# Patient Record
Sex: Male | Born: 1995 | Race: White | Hispanic: No | Marital: Single | Smoking: Current every day smoker
Health system: Southern US, Community
[De-identification: ages and names within clinical notes are randomized; demographics above are authoritative.]

## PROBLEM LIST (undated history)

## (undated) DIAGNOSIS — R569 Unspecified convulsions: Secondary | ICD-10-CM

---
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
## 2015-01-28 ENCOUNTER — Other Ambulatory Visit: Payer: BLUE CROSS/BLUE SHIELD

## 2015-01-28 ENCOUNTER — Ambulatory Visit (INDEPENDENT_AMBULATORY_CARE_PROVIDER_SITE_OTHER): Payer: BLUE CROSS/BLUE SHIELD | Admitting: Neurology

## 2015-01-28 ENCOUNTER — Encounter: Payer: Self-pay | Admitting: Neurology

## 2015-01-28 VITALS — BP 128/80 | HR 63 | Ht 71.0 in | Wt 198.0 lb

## 2015-01-28 DIAGNOSIS — R9089 Other abnormal findings on diagnostic imaging of central nervous system: Secondary | ICD-10-CM

## 2015-01-28 DIAGNOSIS — R930 Abnormal findings on diagnostic imaging of skull and head, not elsewhere classified: Secondary | ICD-10-CM | POA: Diagnosis not present

## 2015-01-28 DIAGNOSIS — G40009 Localization-related (focal) (partial) idiopathic epilepsy and epileptic syndromes with seizures of localized onset, not intractable, without status epilepticus: Secondary | ICD-10-CM | POA: Diagnosis not present

## 2015-01-28 MED ORDER — LAMOTRIGINE ER 100 MG PO TB24: ORAL_TABLET | ORAL | Status: DC

## 2015-01-28 NOTE — Patient Instructions (Signed)
1. Continue Lamictal ER 100mg  daily, start taking at night and see if this helps with appetite 2. Bloodwork for Lamictal level 3. Follow-up in 2 months, call for any changes  Seizure Precautions: 1. If medication has been prescribed for you to prevent seizures, take it exactly as directed.  Do not stop taking the medicine without talking to your doctor first, even if you have not had a seizure in a long time.   2. Avoid activities in which a seizure would cause danger to yourself or to others.  Don't operate dangerous machinery, swim alone, or climb in high or dangerous places, such as on ladders, roofs, or girders.  Do not drive unless your doctor says you may.  3. If you have any warning that you may have a seizure, lay down in a safe place where you can't hurt yourself.    4.  No driving for 6 months from last seizure, [place].   Please refer to the following link on the Epile[employer] of [employer]'s website for more information: http://www.epilepsyfoundation.org/answerplace/Social/driving/drivingu.cfm   5.  Maintain good sleep hygiene.  6.  Contact your doctor if you have any problems that may be related to the medicine you are taking.  7.  Call 911 and bring the patient back to the ED if:        A.  The seizure lasts longer than 5 minutes.       B.  The patient doesn't awaken shortly after the seizure  C.  The patient has new problems such as difficulty seeing, speaking or moving  D.  The patient was injured during the seizure  E.  The patient has a temperature over 102 F (39C)  F.  The patient vomited and now is having trouble breathing

## 2015-01-28 NOTE — Progress Notes (Signed)
NEUROLOGY FOLLOW UP OFFICE NOTE  Larry Martinez [id number]  HISTORY OF PRESENT ILLNESS: I had the pleasure of seeing Larry Martinez in follow-up in the neurology clinic on 01/28/2015.  The patient was last seen 3 months ago for new onset convulsion last 09/06/14. He is accompanied by his parents who help supplement the history today. Since his last visit, he continues to do well with no seizures since June 2016. He is tolerating Lamotrigine ER  daily without significant side effects, no dizziness, diplopia, gait instability or falls, but reports changes in appetite. He is not hungry at breakfast or lunch, he eats at 2pm, then at midnight. He usually takes his medication at noon. He denies any episodes of vision changes, focal numbness/tingling/weakness, no gaps in times or staring/unresponsive episodes.   HPI: This is a pleasant 19 yo LH man with a history of recurrent childhood episodes of right-sided weakness that resolved at age 53, unclear diagnosis, presenting after a generalized tonic-clonic seizure last 09/06/2014. His mother reports that from age 12 months up to 4 years, he would have episodes where the right side of his body would become paralyzed, the right side of his face would droop with drooling, lasting 6 minutes, occurring 2-3 times a year. No convulsive activity or jerking. He was evaluated in [place] and [place] with several MRIs, and was not started on any medication. His mother recalls being told there was a "slight abnormality in the back or the brainstem." Since then, he denied any further right-sided episodes but would have "trigger sparks" of light on the right visual field lasting up to 6 minutes, occurring once a year or so. This year he had it twice. He was sitting in the back of the car on 09/06/14 and saw the trigger sparks on his right visual field, then had tingling in his right foot that shot up the entire right side. He was unable to see anything out the  right side, and tried to speak but could not verbalize. He kicked his mother's seat with his left left to get her attention, and when she looked back she heard him making growling noises with his head turned to the left side, body tensed up for 8 minutes. He had urinary incontinence. He recalls waking up in the ambulance with right-sided numbness, he felt he could not move his entire body, his speech was slurred for a while. He was brought to [facility] ER where CBC showed a WBC of 15.4, neutrophils 80%, CMP showed a creatinine of 1.29, LFTs normal, UDS positive for opiates and THC. I personally reviewed head CT without contrast which did not show any acute changes. He does admit to using THC, but denies any opiate intake. He denies any sleep deprivation or alcohol use prior to the seizure. He uses the hookah daily.   His mother brings some records from his childhood. He started having symptoms at age 45 months. His eyes are always fixed to the right, drooling, he cannot move his limbs. These episodes occur when he is about to wake up or go to sleep. He was reported to have right hand jerking. He was evaluated at [facility] by Dr. Carolanne Grumbling, impression was recurrent partial seizures, suggestive of benign rolandic epilepsy. They moved to [place], and there were several more seizures in 2002, with note of right arm and leg weakness that lasted up to 1-1/2 hours. He was evaluated by epileptologist Dr. Inez Catalina at [facility], per mother's notes, there were no physical abnormalities on MRI but  the EEG showed a slight abnormality in his brain waves, similar to results from [place], reports unavailable for review. There were no further seizures after 01/13/2001.   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 Otherwise had a normal early development. There is no history of febrile convulsions, CNS infections such as meningitis/encephalitis, significant traumatic brain injury, neurosurgical procedures, or family history of seizures.  Diagnostic Data: I personally reviewed MRI brain with and without contrast done 09/19/14 and discussed this with the radiologist as well. There is asymmetry noted in the left caudate nucleus, the head is present however the body appears to be absent, with the tail presumably absent as well. There is a focal convex outpouching along the body of the left lateral ventricle where the body of the caudate should be. There is a 1.87mm cyst at the anterior margin of the left caudate head. No discrete heterotopia identified. The temporal horn of the left lateral ventricle is mildly enlarged compared to the right with mild rotation of the left hippocampal head and body, however no abnormal signal is identified in the hippocampus. There is a hyperintense T1 and T2 signal along the left aspect of the posterior falx at the level of the occipital lobes measuring 1-14mm in thickness without mass effect, suggestive of trace subdural hematoma. His routine EEG was abnormal with focal slowing over the left hemisphere, maximal over the left temporo-occipital region, no epileptiform discharges seen.  PAST MEDICAL HISTORY: Past Medical History  Diagnosis Date  . Seizures (HCC)     MEDICATIONS: Current Outpatient Prescriptions on File Prior to Visit  Medication Sig Dispense Refill  . LamoTRIgine 100 MG TB24 Take 1 tablet daily 30 tablet 6   No current facility-administered medications on file prior to visit.    ALLERGIES: No Known Allergies  FAMILY HISTORY: Family History  Problem Relation Age of Onset  . Heart disease Maternal Grandfather   . Heart disease Maternal Grandfather   . Diabetes Maternal Grandmother   .  Diabetes Maternal Grandfather   . Hypertension Mother   . Cancer Paternal Grandfather   . Cancer Maternal Grandmother     SOCIAL HISTORY: Social History   Social History  . Marital Status: Single    Spouse Name: N/A  . Number of Children: N/A  . Years of Education: N/A   Occupational History  . Not on file.   Social History Main Topics  . Smoking status: Current Every Day Smoker    Types: E-cigarettes  . Smokeless tobacco: Never Used  . Alcohol Use: No  . Drug Use: Yes    Special: Marijuana  . Sexual Activity: Not on file   Other Topics Concern  . Not on file   Social History Narrative    REVIEW OF SYSTEMS: Constitutional: No fevers, chills, or sweats, no generalized fatigue,+ change in appetite Eyes: No visual changes, double vision, eye pain Ear, nose and throat: No hearing loss, ear pain, nasal congestion, sore throat Cardiovascular: No chest pain, palpitations Respiratory:  No shortness of breath at rest or with exertion, wheezes GastrointestinaI: No nausea, vomiting, diarrhea, abdominal pain, fecal incontinence Genitourinary:  No dysuria, urinary retention or frequency Musculoskeletal:  No neck pain, back  pain Integumentary: No rash, pruritus, skin lesions Neurological: as above Psychiatric: No depression, insomnia, anxiety Endocrine: No palpitations, fatigue, diaphoresis, mood swings,+ change in appetite, no change in weight, increased thirst Hematologic/Lymphatic:  No anemia, purpura, petechiae. Allergic/Immunologic: no itchy/runny eyes, nasal congestion, recent allergic reactions, rashes  PHYSICAL EXAM: Filed Vitals:   01/28/15 0945  BP: 128/80  Pulse: 63   General: No acute distress Head:  Normocephalic/atraumatic Neck: supple, no paraspinal tenderness, full range of motion Heart:  Regular rate and rhythm Lungs:  Clear to auscultation bilaterally Back: No paraspinal tenderness Skin/Extremities: No rash, no edema Neurological Exam: alert and  oriented to person, place, and time. No aphasia or dysarthria. Fund of knowledge is appropriate.  Recent and remote memory are intact. 2/3 delayed recall.  Attention and concentration are normal.    Able to name objects and repeat phrases. Cranial nerves: Pupils equal, round, reactive to light.  Fundoscopic exam unremarkable, no papilledema. Extraocular movements intact with no nystagmus. Visual fields full. Facial sensation intact. No facial asymmetry. Tongue, uvula, palate midline.  Motor: Bulk and tone normal, muscle strength 5/5 throughout with no pronator drift.  Sensation to light touch intact.  No extinction to double simultaneous stimulation.  Deep tendon reflexes 2+ throughout, toes downgoing.  Finger to nose testing intact.  Gait narrow-based and steady, able to tandem walk adequately.  Romberg negative.  IMPRESSION: This is an 19 yo LH man with a history of childhood episodes of recurrent right-sided weakness with right gaze deviation that resolved at age 89, recurrent episodes of vision changes on the right visual field, who had the same visual changes followed by right-sided sensory symptoms, speech arrest, then a witnessed GTC last 09/06/14. 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 It is unclear how he would have a subdural, he did not hit his head in the car, I wonder about change in the occipital region with the visual symptoms he has been having for several years. He denies any further visual changes or convulsions since starting Lamictal. He reports some appetite changes and will try taking medication at night and monitor appetite. A baseline Lamictal level will be done today. He is aware of [place] driving laws that indicate one should not drive until 6 months seizure-free. He will follow-up in 2 months and knows to call our office for any  problems in the interim.  Thank you for allowing me to participate in his care.  Please do not hesitate to call for any questions or concerns.  The duration of this appointment visit was 24 minutes of face-to-face time with the patient.  Greater than 50% of this time was spent in counseling, explanation of diagnosis, planning of further management, and coordination of care.   Patrcia DollyKaren Aquino, M.D.   CC: Dr. Tommi EmeryHepler

## 2015-01-29 ENCOUNTER — Other Ambulatory Visit: Payer: Self-pay | Admitting: Family Medicine

## 2015-01-29 DIAGNOSIS — G40009 Localization-related (focal) (partial) idiopathic epilepsy and epileptic syndromes with seizures of localized onset, not intractable, without status epilepticus: Secondary | ICD-10-CM

## 2015-01-29 MED ORDER — LAMOTRIGINE ER 100 MG PO TB24: ORAL_TABLET | ORAL | Status: DC

## 2015-01-29 NOTE — Telephone Encounter (Signed)
90 day refill request

## 2015-02-03 LAB — LAMOTRIGINE LEVEL: LAMOTRIGINE LVL: 2.2 ug/mL — AB (ref 4.0–18.0)

## 2015-02-06 ENCOUNTER — Telehealth: Payer: Self-pay | Admitting: Neurology

## 2015-02-06 DIAGNOSIS — G40009 Localization-related (focal) (partial) idiopathic epilepsy and epileptic syndromes with seizures of localized onset, not intractable, without status epilepticus: Secondary | ICD-10-CM

## 2015-02-06 MED ORDER — LAMOTRIGINE ER 100 MG PO TB24
ORAL_TABLET | ORAL | Status: DC
Start: 2015-02-06 — End: 2015-03-18

## 2015-02-06 NOTE — Telephone Encounter (Signed)
Spoke to mother, Larry Martinez Lamictal level was low 2.2. Typically range is between 4-12, if he is not having seizures, potentially we can just stay on this dose and follow him clinically, however he may benefit from having more cushion if he gets sleep-deprived or misses doses. Mother agreeable to increase dose to Lamotrigine ER 200mg /day. Refills sent for new dosing instructions.

## 2015-02-07 ENCOUNTER — Telehealth: Payer: Self-pay | Admitting: Neurology

## 2015-02-07 NOTE — Telephone Encounter (Signed)
Spoke with patient's mom. She states patient had not taken his meds the day he had his level checked and she wanted to make sure this was taken into consideration before he increased his medication. Please advise.

## 2015-02-07 NOTE — Telephone Encounter (Signed)
Pls let her know that is exactly when we want to check his levels, we want to see how much is in his system when it is at the lowest. Thanks

## 2015-02-07 NOTE — Telephone Encounter (Signed)
Patient's dad make aware of advise from Dr Karel JarvisAquino.

## 2015-02-07 NOTE — Telephone Encounter (Signed)
Pt/mother called about med/ Lamotrigine/concerns//call back @ [phone]

## 2015-03-18 ENCOUNTER — Ambulatory Visit (INDEPENDENT_AMBULATORY_CARE_PROVIDER_SITE_OTHER): Payer: BLUE CROSS/BLUE SHIELD | Admitting: Neurology

## 2015-03-18 ENCOUNTER — Encounter: Payer: Self-pay | Admitting: Neurology

## 2015-03-18 VITALS — BP 112/72 | HR 89 | Ht 71.0 in | Wt 198.0 lb

## 2015-03-18 DIAGNOSIS — G40009 Localization-related (focal) (partial) idiopathic epilepsy and epileptic syndromes with seizures of localized onset, not intractable, without status epilepticus: Secondary | ICD-10-CM

## 2015-03-18 DIAGNOSIS — R9089 Other abnormal findings on diagnostic imaging of central nervous system: Secondary | ICD-10-CM

## 2015-03-18 DIAGNOSIS — R930 Abnormal findings on diagnostic imaging of skull and head, not elsewhere classified: Secondary | ICD-10-CM | POA: Diagnosis not present

## 2015-03-18 MED ORDER — LAMOTRIGINE ER 100 MG PO TB24
ORAL_TABLET | ORAL | Status: DC
Start: 2015-03-18 — End: 2016-01-19

## 2015-03-18 NOTE — Patient Instructions (Signed)
1. Continue Lamotrigine ER 100mg : Take 2 tablets daily 2. Follow-up in 6 months, call for any changes  Seizure Precautions: 1. If medication has been prescribed for you to prevent seizures, take it exactly as directed.  Do not stop taking the medicine without talking to your doctor first, even if you have not had a seizure in a long time.   2. Avoid activities in which a seizure would cause danger to yourself or to others.  Don't operate dangerous machinery, swim alone, or climb in high or dangerous places, such as on ladders, roofs, or girders.  Do not drive unless your doctor says you may.  3. If you have any warning that you may have a seizure, lay down in a safe place where you can't hurt yourself.    4.  No driving for 6 months from last seizure, [place].   Please refer to the following link on the [employer]'s website for more information: http://www.epilepsyfoundation.org/answerplace/Social/driving/drivingu.cfm   5.  Maintain good sleep hygiene. Avoid alcohol.  6.  Contact your doctor if you have any problems that may be related to the medicine you are taking.  7.  Call 911 and bring the patient back to the ED if:        A.  The seizure lasts longer than 5 minutes.       B.  The patient doesn't awaken shortly after the seizure  C.  The patient has new problems such as difficulty seeing, speaking or moving  D.  The patient was injured during the seizure  E.  The patient has a temperature over 102 F (39C)  F.  The patient vomited and now is having trouble breathing

## 2015-03-18 NOTE — Progress Notes (Signed)
NEUROLOGY FOLLOW UP OFFICE NOTE  Larry Martinez [id number]  HISTORY OF PRESENT ILLNESS: I had the pleasure of seeing Larry Martinez in follow-up in the neurology clinic on 02/16/2015.  The patient was last seen 2 months ago for new onset convulsion last 09/06/14. He is accompanied by his parents who help supplement the history today. Since his last visit, he continues to do well with no seizures since June 2016. He reported changes in appetite with Lamotrigine ER, and was instructed to try taking it at night. His family noted mood changes and is now back to taking it at noon, which helped improve mood changes. He reports appetite is better. His Lamictal level was low at 2.2, we had discussed increasing dose to /day, which he is tolerating without side effects. He denies any episodes of vision changes, focal numbness/tingling/weakness, no gaps in times or staring/unresponsive episodes. He has noticed he is more sensitive to bright Christmas lights, but no seizure-like symptoms triggered.  HPI 09/09/2014: This is a pleasant 19 yo LH man with a history of recurrent childhood episodes of right-sided weakness that resolved at age 19, unclear diagnosis, presenting after a generalized tonic-clonic seizure last 09/06/2014. His mother reports that from age 79 months up to 4 years, he would have episodes where the right side of his body would become paralyzed, the right side of his face would droop with drooling, lasting 6 minutes, occurring 2-3 times a year. No convulsive activity or jerking. He was evaluated in [place] and [place] with several MRIs, and was not started on any medication. His mother recalls being told there was a "slight abnormality in the back or the brainstem." Since then, he denied any further right-sided episodes but would have "trigger sparks" of light on the right visual field lasting up to 6 minutes, occurring once a year or so. This year he had it twice. He was sitting in  the back of the car on 09/06/14 and saw the trigger sparks on his right visual field, then had tingling in his right foot that shot up the entire right side. He was unable to see anything out the right side, and tried to speak but could not verbalize. He kicked his mother's seat with his left left to get her attention, and when she looked back she heard him making growling noises with his head turned to the left side, body tensed up for 8 minutes. He had urinary incontinence. He recalls waking up in the ambulance with right-sided numbness, he felt he could not move his entire body, his speech was slurred for a while. He was brought to [facility] ER where CBC showed a WBC of 15.4, neutrophils 80%, CMP showed a creatinine of 1.29, LFTs normal, UDS positive for opiates and THC. I personally reviewed head CT without contrast which did not show any acute changes. He does admit to using THC, but denies any opiate intake. He denies any sleep deprivation or alcohol use prior to the seizure. He uses the hookah daily.   His mother brings some records from his childhood. He started having symptoms at age 19 months. His eyes are always fixed to the right, drooling, he cannot move his limbs. These episodes occur when he is about to wake up or go to sleep. He was reported to have right hand jerking. He was evaluated at [facility] by Dr. Carolanne Martinez, impression was recurrent partial seizures, suggestive of benign rolandic epilepsy. [place] moved to [place], and there were several more seizures in 2002, with  note of right arm and leg weakness that lasted up to 1-1/2 hours. He was evaluated by epileptologist Dr. Inez Martinez at [facility], per mother's notes, there were no physical abnormalities on MRI but the EEG showed a slight abnormality in his brain waves, similar to results from [place], reports unavailable for review. There were no further seizures after 01/13/2001.   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 Otherwise had a normal early development. There is no history of febrile convulsions, CNS infections such as meningitis/encephalitis, significant traumatic brain injury, neurosurgical procedures, or family history of seizures.  Diagnostic Data: I personally reviewed MRI brain with and without contrast done 09/19/14 and discussed this with the radiologist as well. There is asymmetry noted in the left caudate nucleus, the head is present however the body appears to be absent, with the tail presumably absent as well. There is a focal convex outpouching along the body of the left lateral ventricle where the body of the caudate should be. There is a 1.56mm cyst at the anterior margin of the left caudate head. No discrete heterotopia identified. The temporal horn of the left lateral ventricle is mildly enlarged compared to the right with mild rotation of the left hippocampal head and body, however no abnormal signal is identified in the hippocampus. There is a hyperintense T1 and T2 signal along the left aspect of the posterior falx at the level of the occipital lobes measuring 1-57mm in thickness without mass effect, suggestive of trace subdural hematoma. His routine EEG was abnormal with focal slowing over the left hemisphere, maximal over the left temporo-occipital region, no epileptiform discharges seen.  PAST MEDICAL HISTORY: Past Medical History  Diagnosis Date  . Seizures (HCC)     MEDICATIONS: Current Outpatient Prescriptions on File Prior to Visit  Medication Sig Dispense Refill  . LamoTRIgine 100 MG TB24 Take 2 tablet daily 180 tablet 3   No current facility-administered medications on file prior to visit.    ALLERGIES: No Known  Allergies  FAMILY HISTORY: Family History  Problem Relation Age of Onset  . Heart disease Maternal Grandfather   . Heart disease Maternal Grandfather   . Diabetes Maternal Grandmother   . Diabetes Maternal Grandfather   . Hypertension Mother   . Cancer Paternal Grandfather   . Cancer Maternal Grandmother     SOCIAL HISTORY: Social History   Social History  . Marital Status: Single    Spouse Name: N/A  . Number of Children: N/A  . Years of Education: N/A   Occupational History  . Not on file.   Social History Main Topics  . Smoking status: Current Every Day Smoker    Types: E-cigarettes  . Smokeless tobacco: Never Used  . Alcohol Use: No  . Drug Use: Yes    Special: Marijuana  . Sexual Activity: Not on file   Other Topics Concern  . Not on file   Social History Narrative    REVIEW OF SYSTEMS: Constitutional: No fevers, chills, or sweats, no generalized fatigue, change in appetite Eyes: No visual changes, double vision, eye pain Ear, nose and throat: No hearing loss, ear pain, nasal congestion, sore throat Cardiovascular: No chest pain, palpitations Respiratory:  No shortness of breath at rest or with exertion, wheezes GastrointestinaI: No nausea, vomiting, diarrhea, abdominal pain, fecal incontinence Genitourinary:  No dysuria, urinary retention or frequency Musculoskeletal:  No neck pain, back pain Integumentary: No rash, pruritus, skin lesions Neurological: as above Psychiatric: No depression, insomnia, anxiety Endocrine: No palpitations, fatigue, diaphoresis, mood swings, change in appetite, no change in weight, increased thirst Hematologic/Lymphatic:  No anemia, purpura, petechiae. Allergic/Immunologic: no itchy/runny eyes, nasal congestion, recent allergic reactions, rashes  PHYSICAL EXAM: Filed Vitals:   03/18/15 1135  BP: 112/72  Pulse: 89   General: No acute distress Head:  Normocephalic/atraumatic Neck: supple, no paraspinal tenderness, full  range of motion Heart:  Regular rate and rhythm Lungs:  Clear to auscultation bilaterally Back: No paraspinal tenderness Skin/Extremities: No rash, no edema Neurological Exam: alert and oriented to person, place, and time. No aphasia or dysarthria. Fund of knowledge is appropriate.  Recent and remote memory are intact. 3/3 delayed recall.  Attention and concentration are normal.    Able to name objects and repeat phrases. Cranial nerves: Pupils equal, round, reactive to light.  Extraocular movements intact with no nystagmus. Visual fields full. Facial sensation intact. No facial asymmetry. Tongue, uvula, palate midline.  Motor: Bulk and tone normal, muscle strength 5/5 throughout with no pronator drift.  Sensation to light touch intact.  No extinction to double simultaneous stimulation.  Finger to nose testing intact.  Gait narrow-based and steady, able to tandem walk adequately.  Romberg negative.  IMPRESSION: This is an 19 yo LH man with a history of childhood episodes of recurrent right-sided weakness with right gaze deviation that resolved at age 89, recurrent episodes of vision changes on the right visual field, who had the same visual changes followed by right-sided sensory symptoms, speech arrest, then a witnessed GTC last 09/06/14. 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 It is unclear how he would have a subdural, he did not hit his head in the car, I wonder about change in the occipital region with the visual symptoms he has been having for several years. He denies any further visual changes or convulsions since starting Lamictal. He is now on Lamotrigine ER 200mg /day with no side effects, no seizures since 09/06/14. We again discussed avoidance of seizure triggers, including missing medications, alcohol, and sleep deprivation. We again discussed  [place] driving laws that indicate one should not drive until 6 months seizure-free. He will follow-up in 6 months and knows to call our office for any problems in the interim.  Thank you for allowing me to participate in his care.  Please do not hesitate to call for any questions or concerns.  The duration of this appointment visit was 15 minutes of face-to-face time with the patient.  Greater than 50% of this time was spent in counseling, explanation of diagnosis, planning of further management, and coordination of care.   Larry Martinez, M.D.   CC: Dr. Tommi EmeryHepler

## 2015-05-23 ENCOUNTER — Encounter: Payer: Self-pay | Admitting: Neurology

## 2015-09-12 ENCOUNTER — Telehealth: Payer: Self-pay | Admitting: Neurology

## 2015-09-12 DIAGNOSIS — G40009 Localization-related (focal) (partial) idiopathic epilepsy and epileptic syndromes with seizures of localized onset, not intractable, without status epilepticus: Secondary | ICD-10-CM

## 2015-09-12 MED ORDER — LAMOTRIGINE ER 200 MG PO TB24: 200.0000 mg | ORAL_TABLET | Freq: Every day | ORAL | Status: DC

## 2015-09-12 NOTE — Telephone Encounter (Signed)
Called father, Lorin PicketScott, to inform him that unfortuately, I have no idea about cost. Gave options of sending in the 100 BID or the 200 mg daily, or wait until Monday and give pt time to call insurance and/or Pharmacy to find out what way would be cheapest. Father requested we just go ahead and send the 200 mg tablets in. RX was sent to pharmacy.

## 2015-09-12 NOTE — Telephone Encounter (Signed)
Larry Martinez Jan 15, 1996. His father Lorin PicketScott called needing to get a refill on Lamotrigine 100 MG. He was taking 1 pill a day then Dr. Karel JarvisAquino increased it to 2 a day. When he went to fill it, it was $250. He does have 10 pills left.  The father was wondering if it would be cheaper to call in a 200 MG prescription? [facility] [place].  His number is [phone]. Thank you

## 2015-09-19 ENCOUNTER — Ambulatory Visit: Payer: BLUE CROSS/BLUE SHIELD | Admitting: Neurology

## 2016-01-19 ENCOUNTER — Ambulatory Visit (INDEPENDENT_AMBULATORY_CARE_PROVIDER_SITE_OTHER): Payer: BLUE CROSS/BLUE SHIELD | Admitting: Neurology

## 2016-01-19 ENCOUNTER — Encounter: Payer: Self-pay | Admitting: Neurology

## 2016-01-19 VITALS — BP 118/62 | HR 73 | Temp 97.8°F | Wt 189.3 lb

## 2016-01-19 DIAGNOSIS — G40009 Localization-related (focal) (partial) idiopathic epilepsy and epileptic syndromes with seizures of localized onset, not intractable, without status epilepticus: Secondary | ICD-10-CM | POA: Diagnosis not present

## 2016-01-19 MED ORDER — LAMOTRIGINE ER 200 MG PO TB24: 200.0000 mg | ORAL_TABLET | Freq: Every day | ORAL | 3 refills | Status: DC

## 2016-01-19 NOTE — Patient Instructions (Signed)
1. Continue Lamictal XR 200mg  daily 2. Continue to monitor your symptoms, call for any changes 3. Follow-up in 6 months

## 2016-01-19 NOTE — Progress Notes (Signed)
NEUROLOGY FOLLOW UP OFFICE NOTE  Larry Martinez [id number]  HISTORY OF PRESENT ILLNESS: I had the pleasure of seeing Brailyn Martinez in follow-up in the neurology clinic on 01/19/2016.  The patient was last seen 10 months ago for new onset convulsion last 09/06/14. He is accompanied by his parents who help supplement the history today. Since his last visit, he continues to do well with no seizures since June 2016. He is not sure if it is the Lamictal, but he sleeps during the day and stays awake at night. He is taking the medication in the daytime. He reports "zoning out" since he started Lamictal, he would stare blankly at someone and not realize it, catching himself, reporting that he hears them but "it's like I'm there but not there." He denies any gaps in time, olfactory/gustatory hallucinations, focal numbness/tingling/weakness, no further vision changes similar to prior to the seizure. He has noticed that lights when he drives at night are more intense, it hurts to look right or left. He does not notice this during the daytime. He has seen his eye doctor with normal exam. He reports his short term memory is "eh," he denies getting lost driving. His mood can go from 0 to 10, depending on how things are. His father spoke to me separately, concerned that he has no motivation to go out and find a job. He stays at home playing video games the whole day.   HPI 09/09/2014: This is a pleasant 20 yo LH man with a history of recurrent childhood episodes of right-sided weakness that resolved at age 20, unclear diagnosis, presenting after a generalized tonic-clonic seizure last 09/06/2014. His mother reports that from age 33 months up to 4 years, he would have episodes where the right side of his body would become paralyzed, the right side of his face would droop with drooling, lasting 6 minutes, occurring 2-3 times a year. No convulsive activity or jerking. He was evaluated in [place] and [place] with  several MRIs, and was not started on any medication. His mother recalls being told there was a "slight abnormality in the back or the brainstem." Since then, he denied any further right-sided episodes but would have "trigger sparks" of light on the right visual field lasting up to 6 minutes, occurring once a year or so. This year he had it twice. He was sitting in the back of the car on 09/06/14 and saw the trigger sparks on his right visual field, then had tingling in his right foot that shot up the entire right side. He was unable to see anything out the right side, and tried to speak but could not verbalize. He kicked his mother's seat with his left left to get her attention, and when she looked back she heard him making growling noises with his head turned to the left side, body tensed up for 8 minutes. He had urinary incontinence. He recalls waking up in the ambulance with right-sided numbness, he felt he could not move his entire body, his speech was slurred for a while. He was brought to [facility] ER where CBC showed a WBC of 15.4, neutrophils 80%, CMP showed a creatinine of 1.29, LFTs normal, UDS positive for opiates and THC. I personally reviewed head CT without contrast which did not show any acute changes. He does admit to using THC, but denies any opiate intake. He denies any sleep deprivation or alcohol use prior to the seizure. He uses the hookah daily.   His mother brings  some records from his childhood. He started having symptoms at age 20 months. His eyes are always fixed to the right, drooling, he cannot move his limbs. These episodes occur when he is about to wake up or go to sleep. He was reported to have right hand jerking. [facility] by Dr. Carolanne Grumblingruse, impression was recurrent partial seizures, suggestive of benign rolandic epilepsy. They moved to [place], and there were several more seizures in 2002, with note of right arm and leg weakness that lasted up to 1-1/2 hours. He was evaluated by  epileptologist Dr. Inez Catalina'Donovan at [facility], per mother's notes, there were no physical abnormalities on MRI but the EEG showed a slight abnormality in his brain waves, similar to results from [place], reports unavailable for review. There were no further seizures after 01/13/2001.   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 Otherwise had a normal early development. There is no history of febrile convulsions, CNS infections such as meningitis/encephalitis, significant traumatic brain injury, neurosurgical procedures, or family history of seizures.  Diagnostic Data: I personally reviewed MRI brain with and without contrast done 09/19/14 and discussed this with the radiologist as well. There is asymmetry noted in the left caudate nucleus, the head is present however the body appears to be absent, with the tail presumably absent as well. There is a focal convex outpouching along the body of the left lateral ventricle where the body of the caudate should be. There is a 1.135mm cyst at the anterior margin of the left caudate head. No discrete heterotopia identified. The temporal horn of the left lateral ventricle is mildly enlarged compared to the right with mild rotation of the left hippocampal head and body, however no abnormal signal is identified in the hippocampus. There is a hyperintense T1 and T2 signal along the left aspect of the posterior falx at the level of the occipital lobes measuring 1-72mm in thickness without mass effect, suggestive of trace subdural hematoma. His routine EEG was abnormal with focal slowing over the left hemisphere, maximal over the left temporo-occipital region, no epileptiform  discharges seen.  PAST MEDICAL HISTORY: Past Medical History:  Diagnosis Date  . Seizures (HCC)     MEDICATIONS: Current Outpatient Prescriptions on File Prior to Visit  Medication Sig Dispense Refill  . LamoTRIgine XR 200 MG TB24 Take 1 tablet (200 mg total) by mouth daily. 90 tablet 1   No current facility-administered medications on file prior to visit.     ALLERGIES: No Known Allergies  FAMILY HISTORY: Family History  Problem Relation Age of Onset  . Heart disease Maternal Grandfather   . Heart disease Maternal Grandfather   . Diabetes Maternal Grandmother   . Diabetes Maternal Grandfather   . Hypertension Mother   . Cancer Paternal Grandfather   . Cancer Maternal Grandmother     SOCIAL HISTORY: Social History   Social History  . Marital status: Single    Spouse name: N/A  . Number of children: N/A  . Years of education: N/A   Occupational History  . Not on file.   Social History Main Topics  . Smoking status: Current Every Day Smoker    Types: E-cigarettes  .  Smokeless tobacco: Never Used  . Alcohol use No  . Drug use:     Types: Marijuana  . Sexual activity: Not on file   Other Topics Concern  . Not on file   Social History Narrative  . No narrative on file    REVIEW OF SYSTEMS: Constitutional: No fevers, chills, or sweats, no generalized fatigue, change in appetite Eyes: No visual changes, double vision, eye pain Ear, nose and throat: No hearing loss, ear pain, nasal congestion, sore throat Cardiovascular: No chest pain, palpitations Respiratory:  No shortness of breath at rest or with exertion, wheezes GastrointestinaI: No nausea, vomiting, diarrhea, abdominal pain, fecal incontinence Genitourinary:  No dysuria, urinary retention or frequency Musculoskeletal:  No neck pain, back pain Integumentary: No rash, pruritus, skin lesions Neurological: as above Psychiatric: No depression, insomnia, anxiety Endocrine: No palpitations, fatigue,  diaphoresis, mood swings, change in appetite, no change in weight, increased thirst Hematologic/Lymphatic:  No anemia, purpura, petechiae. Allergic/Immunologic: no itchy/runny eyes, nasal congestion, recent allergic reactions, rashes  PHYSICAL EXAM: Vitals:   01/19/16 1526  BP: 118/62  Pulse: 73  Temp: 97.8 F (36.6 C)   General: No acute distress Head:  Normocephalic/atraumatic Neck: supple, no paraspinal tenderness, full range of motion Heart:  Regular rate and rhythm Lungs:  Clear to auscultation bilaterally Back: No paraspinal tenderness Skin/Extremities: No rash, no edema Neurological Exam: alert and oriented to person, place, and time. No aphasia or dysarthria. Fund of knowledge is appropriate.  Recent and remote memory are intact. 3/3 delayed recall.  Attention and concentration are normal.    Able to name objects and repeat phrases. Cranial nerves: Pupils equal, round, reactive to light.  Extraocular movements intact with no nystagmus. Visual fields full. Facial sensation intact. No facial asymmetry. Tongue, uvula, palate midline.  Motor: Bulk and tone normal, muscle strength 5/5 throughout with no pronator drift.  Sensation to light touch intact.  No extinction to double simultaneous stimulation.  Finger to nose testing intact.  Gait narrow-based and steady, able to tandem walk adequately.  Romberg negative.  IMPRESSION: This is a 20 yo LH man with a history of childhood episodes of recurrent right-sided weakness with right gaze deviation that resolved at age 41, recurrent episodes of vision changes on the right visual field, who had the same visual changes followed by right-sided sensory symptoms, speech arrest, then a witnessed GTC last 09/06/14. 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 It is unclear how he would have a subdural,  he did not hit his head in the car, I wonder about change in the occipital region with the visual symptoms he has been having for several years. He denies any further visual changes or convulsions since starting Lamictal. He is now on Lamotrigine ER 200mg /day with no side effects, no seizures since 09/06/14. We again discussed avoidance of seizure triggers, including missing medications, alcohol, and sleep deprivation. His father's concerns were discussed with the patient, we discussed the option of seeing a psychiatrist/counselor for mood, if the lack of motivation is due to depression. He will think about it. We again discussed [place] driving laws that indicate one should not drive until 6 months seizure-free. He will follow-up in 6 months and knows to call our office for any problems in the interim.  Thank you for allowing me to participate in his care.  Please do not  hesitate to call for any questions or concerns.  The duration of this appointment visit was 25 minutes of face-to-face time with the patient.  Greater than 50% of this time was spent in counseling, explanation of diagnosis, planning of further management, and coordination of care.   Patrcia Dolly, M.D.   CC: Lovenia Kim, PA-C

## 2016-04-08 IMAGING — MR MR HEAD WO/W CM
9 of 13 series · 34 of 48 positions shown · IV contrast (multihance)
Comparison: Head CT 09/06/2014

CLINICAL DATA: Witnessed seizure 09/06/2014. Abnormal EEG
demonstrating slowing over the left cerebral hemisphere, greatest in
the temporo-occipital region.

EXAM:
MRI HEAD WITHOUT AND WITH CONTRAST
TECHNIQUE: Multiplanar, multiecho pulse sequences of the brain and surrounding
structures were obtained without and with intravenous contrast.
CONTRAST:  19mL MULTIHANCE GADOBENATE DIMEGLUMINE 529 MG/ML IV SOLN

[Series 4: DWI · axial · 3.0mm · 1.09mm/px · z∈[-76,+66]mm · 9 of 98 slices shown (1 of 4)]
[im 1/98]
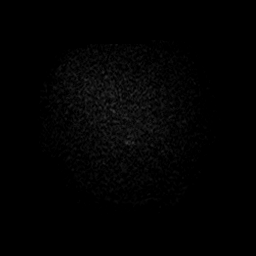
[im 13/98]
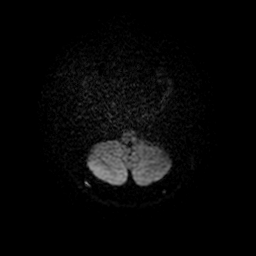
[im 25/98]
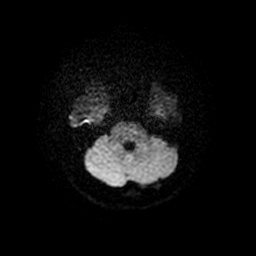
[im 37/98]
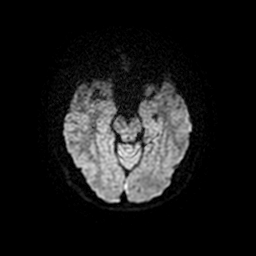
[im 49/98]
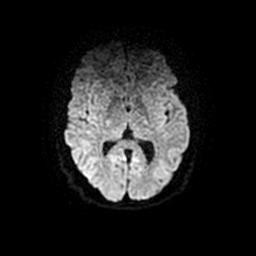
[im 61/98]
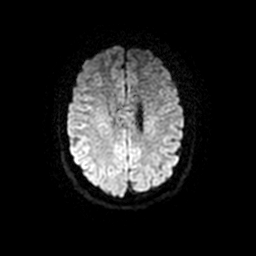
[im 73/98]
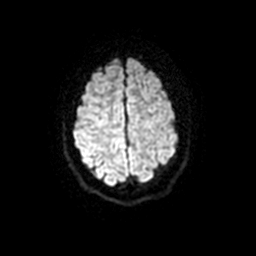
[im 85/98]
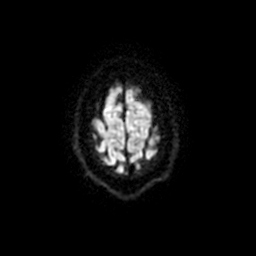
[im 98/98]
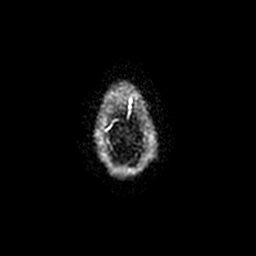

[Series 5: DWI · coronal · 5.0mm · 1.09mm/px · 6 of 74 slices shown (2 of 4)]
[im 1/74]
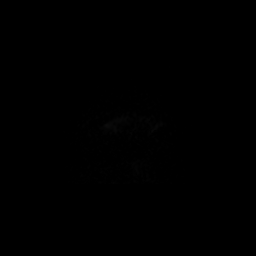
[im 15/74]
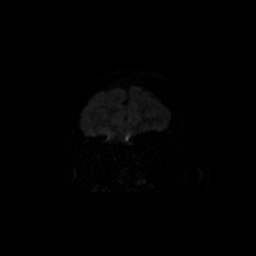
[im 30/74]
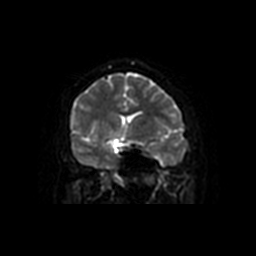
[im 44/74]
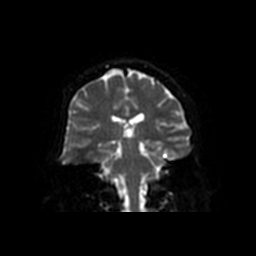
[im 59/74]
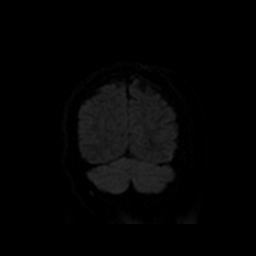
[im 74/74]
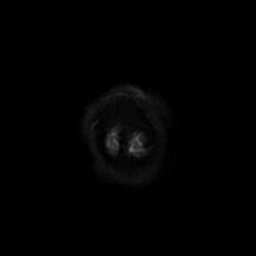

[Series 6: T2 · axial · 5.0mm · 0.43mm/px · z∈[-78,+69]mm · 2 of 24 slices shown (1 of 2)]
[im 1/24]
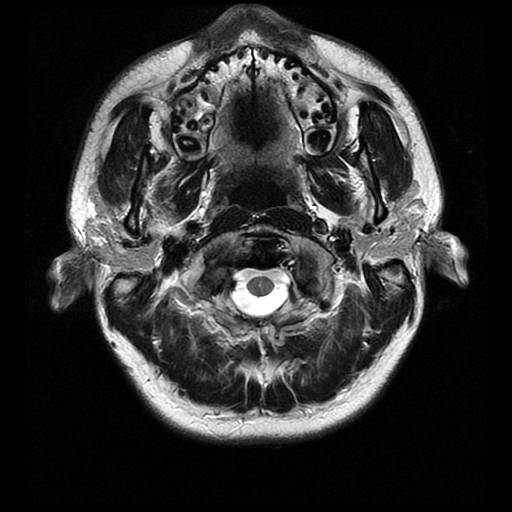
[im 24/24]
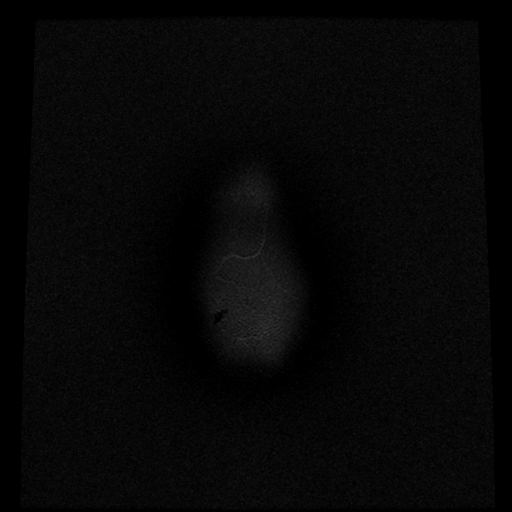

[Series 7: FLAIR · axial · 5.0mm · 0.43mm/px · z∈[-83,+74]mm · 2 of 24 slices shown]
[im 1/24]
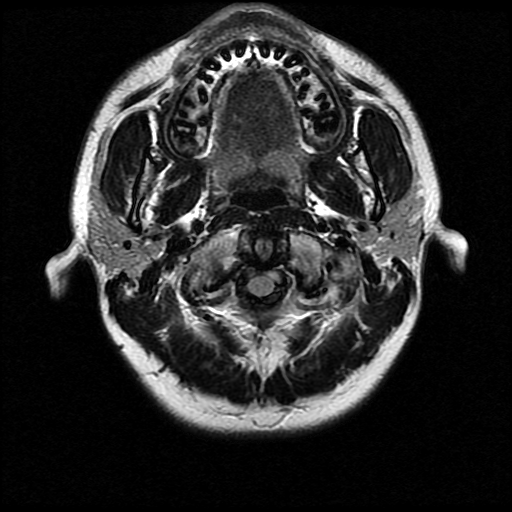
[im 24/24]
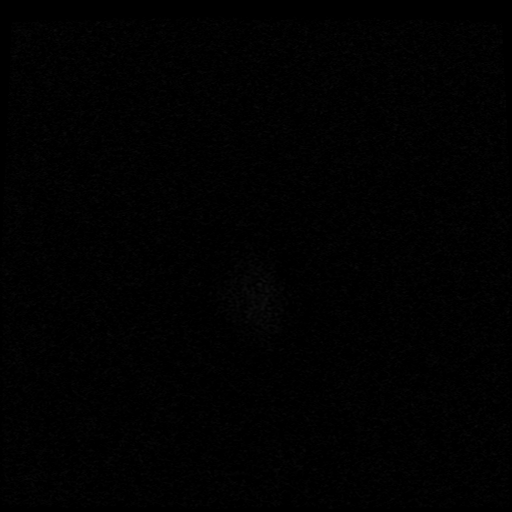

[Series 8: T2 · coronal · 3.0mm · 0.39mm/px · 2 of 26 slices shown (2 of 2)]
[im 1/26]
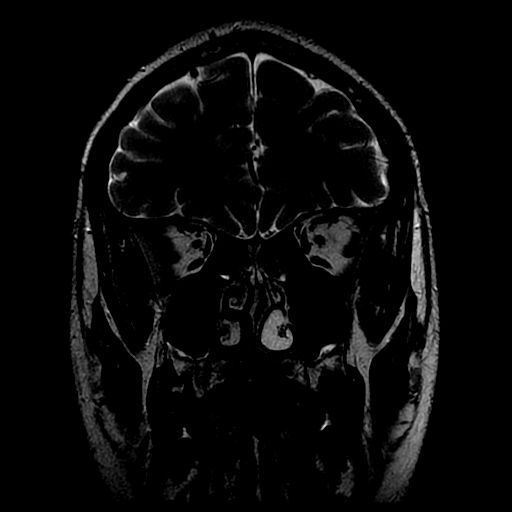
[im 26/26]
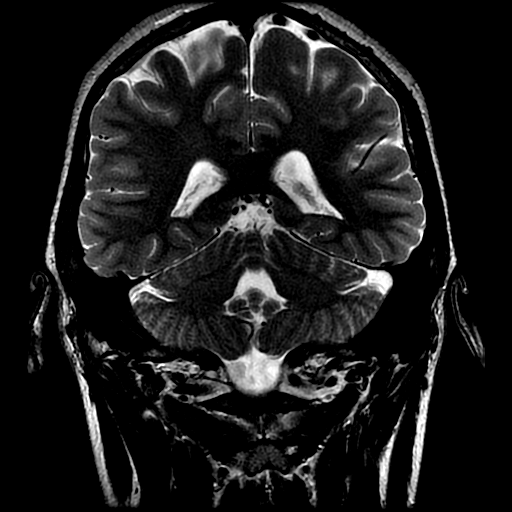

[Series 11: T2 post-contrast · coronal · 5.0mm · 0.45mm/px · 3 of 30 slices shown]
[im 1/30]
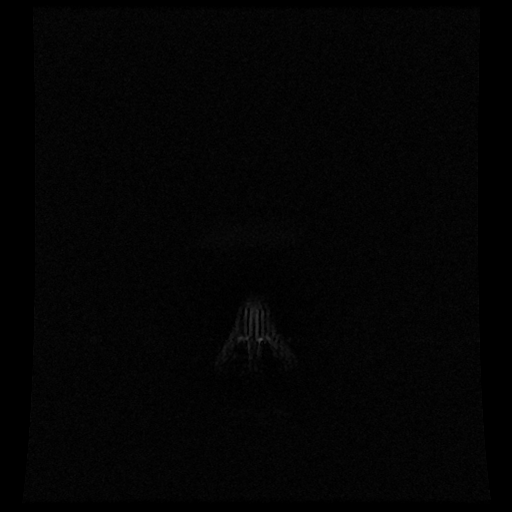
[im 15/30]
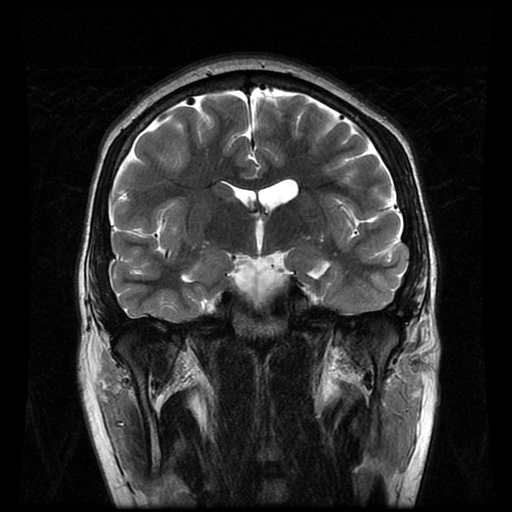
[im 30/30]
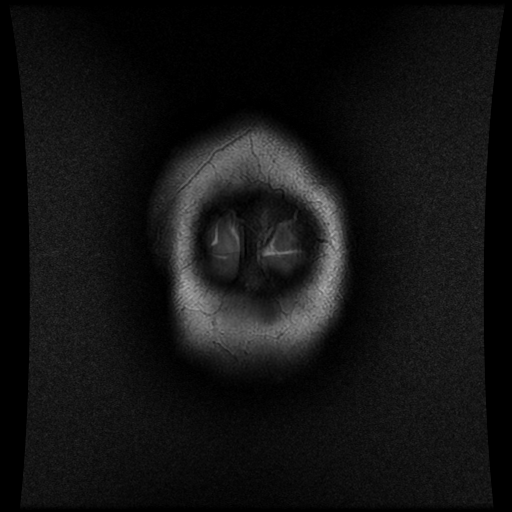

[Series 13: T1 post-contrast · coronal · 5.0mm · 0.45mm/px · 3 of 30 slices shown]
[im 1/30]
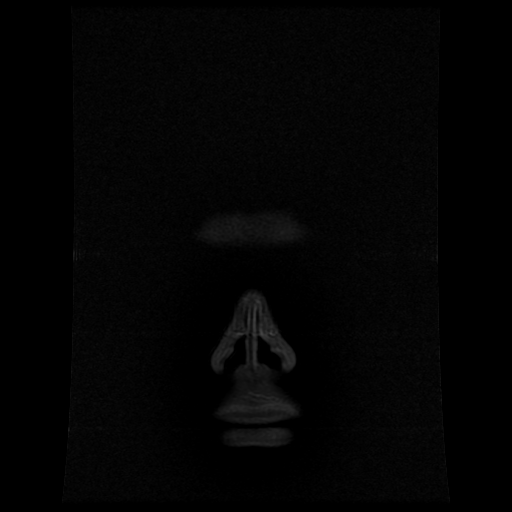
[im 15/30]
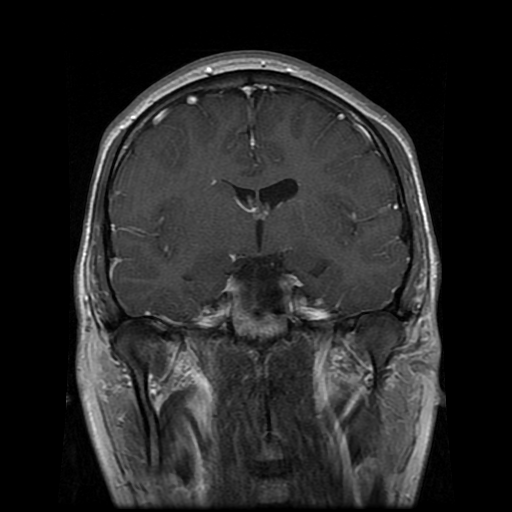
[im 30/30]
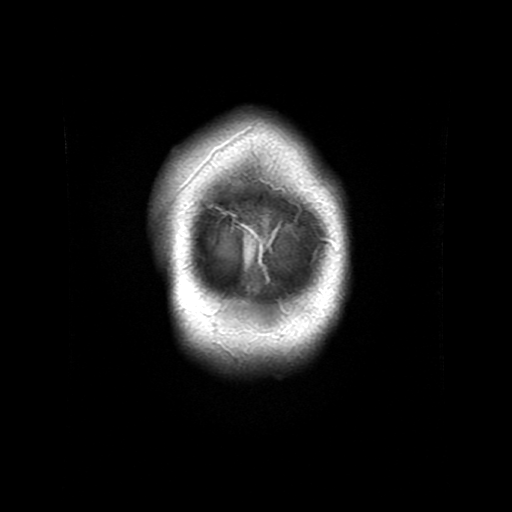

[Series 400: DWI · axial · 3.0mm · 1.09mm/px · z∈[-76,+66]mm · 4 of 49 slices shown (3 of 4)]
[im 1/49]
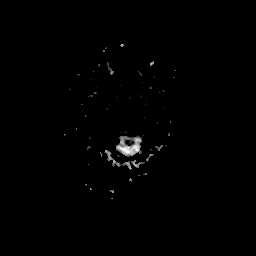
[im 17/49]
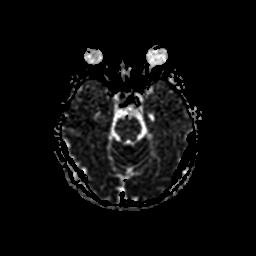
[im 33/49]
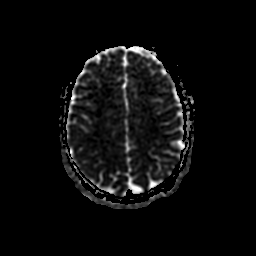
[im 49/49]
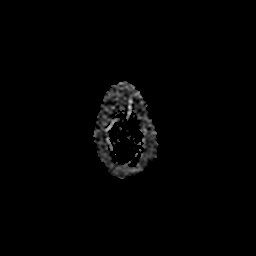

[Series 500: DWI · coronal · 5.0mm · 1.09mm/px · 3 of 37 slices shown (4 of 4)]
[im 1/37]
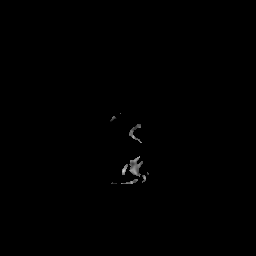
[im 19/37]
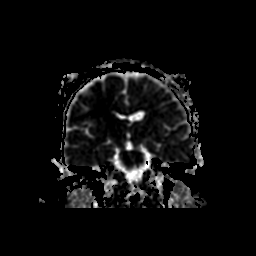
[im 37/37]
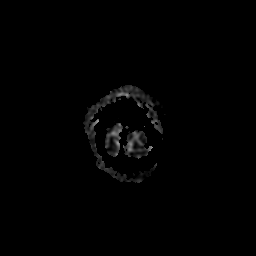

[34 of 48 positions shown; findings below may reference images not displayed]

FINDINGS: Dedicated thin section coronal oblique imaging was performed through
the mesial temporal lobes. The temporal horn of the left lateral
ventricle is mildly enlarged compared to the right with mild
rotation of the left hippocampal head and body, however no abnormal
T2 signal is identified in the hippocampus.

There is no evidence of acute infarct, mass, or midline shift. There
is a trace subdural hematoma along the left aspect of the posterior
falx at the level of the occipital lobes which is T1 and T2
hyperintense and measures 1-2 mm in thickness without mass effect.

There is an asymmetric, abnormal appearance of the left caudate
nucleus. The caudate head is present, however the body appears to be
absent, with the tail presumably absent as well. There is a focal
convex outpouching along the body of the left lateral ventricle
where the body of the caudate should be. There is a 1.5 mm cyst at
the anterior margin of the left caudate head. No discrete
heterotopia is identified.

No white matter disease is identified. A small, incidental
developmental venous anomaly is noted in the left parietal lobe. No
abnormal enhancement is identified elsewhere.

Orbits are unremarkable. There is minimal right maxillary sinus
mucosal thickening. Mastoid air cells are clear. Major intracranial
vascular flow voids are preserved.
IMPRESSION: 1. Developmental absence of the body and likely tail of the left
caudate nucleus.
2. Trace subacute subdural hematoma along the posterior falx.
Critical Value/emergent results were called by telephone at the time
of interpretation on 09/20/2014 at [DATE] to Dr. KENGELBERT TAPIA ASPIAZU , who
verbally acknowledged these results.

## 2016-08-03 ENCOUNTER — Ambulatory Visit (INDEPENDENT_AMBULATORY_CARE_PROVIDER_SITE_OTHER): Payer: BLUE CROSS/BLUE SHIELD | Admitting: Neurology

## 2016-08-03 ENCOUNTER — Encounter: Payer: Self-pay | Admitting: Neurology

## 2016-08-03 VITALS — BP 110/72 | HR 72 | Temp 97.9°F | Ht 71.0 in | Wt 200.0 lb

## 2016-08-03 DIAGNOSIS — G40009 Localization-related (focal) (partial) idiopathic epilepsy and epileptic syndromes with seizures of localized onset, not intractable, without status epilepticus: Secondary | ICD-10-CM | POA: Diagnosis not present

## 2016-08-03 MED ORDER — LAMOTRIGINE ER 200 MG PO TB24: 200.0000 mg | ORAL_TABLET | Freq: Every day | ORAL | 3 refills | Status: DC

## 2016-08-03 NOTE — Patient Instructions (Signed)
1. Continue Lamictal XR  daily 2. Follow-up in 1 year, call for any changes  Seizure Precautions: 1. If medication has been prescribed for you to prevent seizures, take it exactly as directed.  Do not stop taking the medicine without talking to your doctor first, even if you have not had a seizure in a long time.   2. Avoid activities in which a seizure would cause danger to yourself or to others.  Don't operate dangerous machinery, swim alone, or climb in high or dangerous places, such as on ladders, roofs, or girders.  Do not drive unless your doctor says you may.  3. If you have any warning that you may have a seizure, lay down in a safe place where you can't hurt yourself.    4.  No driving for 6 months from last seizure, as per [facility].   Please refer to the following link on the [employer]'s website for more information: http://www.epilepsyfoundation.org/answerplace/Social/driving/drivingu.cfm   5.  Maintain good sleep hygiene. Avoid alcohol.  6.  Contact your doctor if you have any problems that may be related to the medicine you are taking.  7.  Call 911 and bring the patient back to the ED if:        A.  The seizure lasts longer than 5 minutes.       B.  The patient doesn't awaken shortly after the seizure  C.  The patient has new problems such as difficulty seeing, speaking or moving  D.  The patient was injured during the seizure  E.  The patient has a temperature over 102 F (39C)  F.  The patient vomited and now is having trouble breathing

## 2016-08-03 NOTE — Progress Notes (Signed)
NEUROLOGY FOLLOW UP OFFICE NOTE  Larry Martinez [id number]  HISTORY OF PRESENT ILLNESS: I had the pleasure of seeing Larry Martinez in follow-up in the neurology clinic on 08/03/2016.  The patient was last seen 6 months ago for new onset convulsion last 09/06/14. He is alone in the office today. He continues to do well with no seizures since June 2016, no significant side effects on Lamictal XR  daily. He reports sleep is more regular, he walks dogs in the morning, which helps with sleep schedule. He is looking into applying at the [employer] nearby. He continues to report brief episodes every couple of weeks where he "zones out," he can talk and comprehend, drive without difficulty, but feels like he is looking through people. He denies any gaps in time, olfactory/gustatory hallucinations, focal numbness/tingling/weakness, no further vision changes similar to prior to the seizure.   HPI 09/09/2014: This is a pleasant 21 yo LH man with a history of recurrent childhood episodes of right-sided weakness that resolved at age 21, unclear diagnosis, presenting after a generalized tonic-clonic seizure last 09/06/2014. His mother reports that from age 21 months up to 4 years, he would have episodes where the right side of his body would become paralyzed, the right side of his face would droop with drooling, lasting 6 minutes, occurring 2-3 times a year. No convulsive activity or jerking. He was evaluated in [place] and [place] with several MRIs, and was not started on any medication. His mother recalls being told there was a "slight abnormality in the back or the brainstem." Since then, he denied any further right-sided episodes but would have "trigger sparks" of light on the right visual field lasting up to 6 minutes, occurring once a year or so. This year he had it twice. He was sitting in the back of the car on 09/06/14 and saw the trigger sparks on his right visual field, then had tingling in  his right foot that shot up the entire right side. He was unable to see anything out the right side, and tried to speak but could not verbalize. He kicked his mother's seat with his left left to get her attention, and when she looked back she heard him making growling noises with his head turned to the left side, body tensed up for 8 minutes. He had urinary incontinence. He recalls waking up in the ambulance with right-sided numbness, he felt he could not move his entire body, his speech was slurred for a while. He was brought to [facility] ER where CBC showed a WBC of 15.4, neutrophils 80%, CMP showed a creatinine of 1.29, LFTs normal, UDS positive for opiates and THC. I personally reviewed head CT without contrast which did not show any acute changes. He does admit to using THC, but denies any opiate intake. He denies any sleep deprivation or alcohol use prior to the seizure. He uses the hookah daily.   His mother brings some records from his childhood. He started having symptoms at age 42 months. His eyes are always fixed to the right, drooling, he cannot move his limbs. These episodes occur when he is about to wake up or go to sleep. He was reported to have right hand jerking. He was evaluated at [facility] by Dr. Carolanne Grumbling, impression was recurrent partial seizures, suggestive of benign rolandic epilepsy. They moved to [place], and there were several more seizures in 2002, with note of right arm and leg weakness that lasted up to 1-1/2 hours. He was evaluated  by epileptologist Dr. Inez Catalina at [facility], per mother's notes, there were no physical abnormalities on MRI but the EEG showed a slight abnormality in his brain waves, similar to results from [place], reports unavailable for review. There were no further seizures after 01/13/2001.   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 Otherwise had a normal early development. There is no history of febrile convulsions, CNS infections such as meningitis/encephalitis, significant traumatic brain injury, neurosurgical procedures, or family history of seizures.  Diagnostic Data: I personally reviewed MRI brain with and without contrast done 09/19/14 and discussed this with the radiologist as well. There is asymmetry noted in the left caudate nucleus, the head is present however the body appears to be absent, with the tail presumably absent as well. There is a focal convex outpouching along the body of the left lateral ventricle where the body of the caudate should be. There is a 1.47mm cyst at the anterior margin of the left caudate head. No discrete heterotopia identified. The temporal horn of the left lateral ventricle is mildly enlarged compared to the right with mild rotation of the left hippocampal head and body, however no abnormal signal is identified in the hippocampus. There is a hyperintense T1 and T2 signal along the left aspect of the posterior falx at the level of the occipital lobes measuring 1-53mm in thickness without mass effect, suggestive of trace subdural hematoma. His routine EEG was abnormal with focal slowing over the left hemisphere, maximal over the left temporo-occipital region, no epileptiform discharges seen.  PAST MEDICAL HISTORY: Past Medical History:  Diagnosis Date  . Seizures (HCC)     MEDICATIONS: Current Outpatient Prescriptions on File Prior to Visit  Medication Sig Dispense Refill  . LamoTRIgine XR 200 MG TB24 Take 1 tablet (200 mg total) by mouth daily. 90 tablet 3   No current facility-administered medications on file prior to visit.     ALLERGIES: No Known Allergies  FAMILY HISTORY: Family History  Problem Relation Age of Onset  .  Heart disease Maternal Grandfather   . Diabetes Maternal Grandfather   . Diabetes Maternal Grandmother   . Cancer Maternal Grandmother   . Hypertension Mother   . Cancer Paternal Grandfather     SOCIAL HISTORY: Social History   Social History  . Marital status: Single    Spouse name: N/A  . Number of children: N/A  . Years of education: N/A   Occupational History  . Not on file.   Social History Main Topics  . Smoking status: Current Every Day Smoker    Types: E-cigarettes  . Smokeless tobacco: Never Used  . Alcohol use No  . Drug use: Yes    Types: Marijuana  . Sexual activity: Not on file   Other Topics Concern  . Not on file   Social History Narrative  . No narrative on file    REVIEW OF SYSTEMS: Constitutional: No fevers, chills, or sweats, no generalized fatigue, change in appetite Eyes: No visual changes, double vision, eye pain Ear, nose and throat: No hearing loss, ear pain, nasal congestion, sore throat Cardiovascular: No chest pain, palpitations Respiratory:  No shortness of breath at rest or with exertion, wheezes  GastrointestinaI: No nausea, vomiting, diarrhea, abdominal pain, fecal incontinence Genitourinary:  No dysuria, urinary retention or frequency Musculoskeletal:  No neck pain, back pain Integumentary: No rash, pruritus, skin lesions Neurological: as above Psychiatric: No depression, insomnia, anxiety Endocrine: No palpitations, fatigue, diaphoresis, mood swings, change in appetite, no change in weight, increased thirst Hematologic/Lymphatic:  No anemia, purpura, petechiae. Allergic/Immunologic: no itchy/runny eyes, nasal congestion, recent allergic reactions, rashes  PHYSICAL EXAM: Vitals:   08/03/16 1553  BP: 110/72  Pulse: 72  Temp: 97.9 F (36.6 C)   General: No acute distress Head:  Normocephalic/atraumatic Neck: supple, no paraspinal tenderness, full range of motion Heart:  Regular rate and rhythm Lungs:  Clear to auscultation  bilaterally Back: No paraspinal tenderness Skin/Extremities: No rash, no edema Neurological Exam: alert and oriented to person, place, and time. No aphasia or dysarthria. Fund of knowledge is appropriate.  Recent and remote memory are intact. Attention and concentration are normal.    Able to name objects and repeat phrases. Cranial nerves: Pupils equal, round, reactive to light.  Extraocular movements intact with no nystagmus. Visual fields full. Facial sensation intact. No facial asymmetry. Tongue, uvula, palate midline.  Motor: Bulk and tone normal, muscle strength 5/5 throughout with no pronator drift.  Sensation to light touch intact.  No extinction to double simultaneous stimulation.  Finger to nose testing intact.  Gait narrow-based and steady, able to tandem walk adequately.  Romberg negative.  IMPRESSION: This is a 21 yo LH man with a history of childhood episodes of recurrent right-sided weakness with right gaze deviation that resolved at age 23, recurrent episodes of vision changes on the right visual field, who had the same visual changes followed by right-sided sensory symptoms, speech arrest, then a witnessed GTC last 09/06/14. 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 It is unclear how he would have a subdural, he did not hit his head in the car, I wonder about change in the occipital region with the visual symptoms he has been having for several years. He denies any further visual changes or convulsions since starting Lamictal. He is now on Lamotrigine ER /day with no side effects, no seizures since 09/06/14. We again discussed avoidance of seizure triggers, including missing medications, alcohol, and sleep deprivation. He reports zoning out, unclear if seizure-related, he is able to function during these, no confusion or loss of time,  continue to monitor. We again discussed [place] driving laws that indicate one should not drive until 6 months seizure-free. He will follow-up in 1 year and knows to call our office for any problems in the interim.  Thank you for allowing me to participate in his care.  Please do not hesitate to call for any questions or concerns.  The duration of this appointment visit was 15 minutes of face-to-face time with the patient.  Greater than 50% of this time was spent in counseling, explanation of diagnosis, planning of further management, and coordination of care.   Patrcia Dolly, M.D.   CC: Lovenia Kim, PA-C

## 2016-09-17 ENCOUNTER — Other Ambulatory Visit: Payer: Self-pay

## 2016-09-17 ENCOUNTER — Telehealth: Payer: Self-pay | Admitting: Neurology

## 2016-09-17 MED ORDER — LAMOTRIGINE 100 MG PO TABS: 100.0000 mg | ORAL_TABLET | Freq: Two times a day (BID) | ORAL | 6 refills | Status: DC

## 2016-09-17 NOTE — Telephone Encounter (Signed)
PT's father Lorin PicketScott called in regards to his medication Lamotrigine and his insurance changed and is now costing him 80.00 a month and wants to know if there is something cheaper out there

## 2016-09-17 NOTE — Telephone Encounter (Signed)
LMOM with pt father, letting him know Dr Aquino's recommendation of switching from Lamotrigine XR to the immediate release, and that I sent the Rx to pt's listed preferred pharmacy along with new instructions.  Sent to pharmacy; Lamotrigine 100mg , BID, dispense #60, 6 refills.

## 2016-09-17 NOTE — Telephone Encounter (Signed)
The immediate-release Lamotrigine should be cheaper, but will need to be taken twice a day. If he is okay with this, pls send Rx for Lamotrigine 100mg  BID with 6 refills, thanks

## 2016-09-20 NOTE — Telephone Encounter (Signed)
Called Rx below to the pt listed preferred pharmacy.  LMOM with pt letting him know.

## 2017-02-10 ENCOUNTER — Other Ambulatory Visit: Payer: Self-pay | Admitting: Neurology

## 2017-06-16 ENCOUNTER — Other Ambulatory Visit: Payer: Self-pay

## 2017-06-16 MED ORDER — LAMOTRIGINE 100 MG PO TABS: 100.0000 mg | ORAL_TABLET | Freq: Two times a day (BID) | ORAL | 2 refills | Status: DC

## 2017-08-03 ENCOUNTER — Ambulatory Visit: Payer: BLUE CROSS/BLUE SHIELD | Admitting: Neurology

## 2017-08-03 ENCOUNTER — Encounter: Payer: Self-pay | Admitting: Neurology

## 2017-08-03 ENCOUNTER — Other Ambulatory Visit: Payer: Self-pay

## 2017-08-03 VITALS — BP 122/68 | HR 65 | Ht 71.0 in | Wt 199.0 lb

## 2017-08-03 DIAGNOSIS — G40009 Localization-related (focal) (partial) idiopathic epilepsy and epileptic syndromes with seizures of localized onset, not intractable, without status epilepticus: Secondary | ICD-10-CM | POA: Diagnosis not present

## 2017-08-03 MED ORDER — LAMOTRIGINE 100 MG PO TABS: 100.0000 mg | ORAL_TABLET | Freq: Two times a day (BID) | ORAL | 3 refills | Status: DC

## 2017-08-03 NOTE — Patient Instructions (Signed)
1. Continue Lamotrigine  twice a day 2. Consider seeing a counselor if mood changes are more noticeable 3. Follow-up in 1 year, call for any changes  Seizure Precautions: 1. If medication has been prescribed for you to prevent seizures, take it exactly as directed.  Do not stop taking the medicine without talking to your doctor first, even if you have not had a seizure in a long time.   2. Avoid activities in which a seizure would cause danger to yourself or to others.  Don't operate dangerous machinery, swim alone, or climb in high or dangerous places, such as on ladders, roofs, or girders.  Do not drive unless your doctor says you may.  3. If you have any warning that you may have a seizure, lay down in a safe place where you can't hurt yourself.    4.  No driving for 6 months from last seizure, as per [facility].   Please refer to the following link on the [employer]'s website for more information: http://www.epilepsyfoundation.org/answerplace/Social/driving/drivingu.cfm   5.  Maintain good sleep hygiene. Avoid alcohol.  6.  Contact your doctor if you have any problems that may be related to the medicine you are taking.  7.  Call 911 and bring the patient back to the ED if:        A.  The seizure lasts longer than 5 minutes.       B.  The patient doesn't awaken shortly after the seizure  C.  The patient has new problems such as difficulty seeing, speaking or moving  D.  The patient was injured during the seizure  E.  The patient has a temperature over 102 F (39C)  F.  The patient vomited and now is having trouble breathing

## 2017-08-03 NOTE — Progress Notes (Signed)
NEUROLOGY FOLLOW UP OFFICE NOTE  Larry Martinez [id number]  DOB: 07/02/1995  HISTORY OF PRESENT ILLNESS: I had the pleasure of seeing Larry Martinez in follow-up in the neurology clinic on 08/03/2017.  The patient was last seen a year ago for new onset convulsion last 09/06/14. He is alone in the office today. He asked his parents to stay in the waiting room. He continues to do well with no seizures since June 2016. He is taking lamotrigine  BID without side effects. Lamictal XR was cost-prohibitive. He denies any gaps in time, olfactory/gustatory hallucinations, focal numbness/tingling/weakness, no further vision changes similar to prior to the seizure. He denies any headaches, dizziness, no falls. Sleep and appetite are good. He reports his mood is up and down, he "goes from 1 to 100" really quickly. Sometimes people really irritate him. He lives with his parents and is unemployed. 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 His parents expressed to staff that "they just want him to grow up." His friends have gone to college and they have tried to encourage future plans but it appears he has not interest. They report that he is less quick to anger, but seems more withdrawn and wonder about depression.   HPI 09/09/2014: This is a pleasant 22 yo LH man with a history of recurrent childhood episodes of right-sided weakness that resolved at age 22, unclear diagnosis, presenting after a generalized tonic-clonic seizure last 09/06/2014. His mother reports that from age 22 months up to 4 years, he would have episodes where the right side of his body would become paralyzed, the right side of his face would droop with drooling, lasting 6 minutes, occurring 2-3 times a year. No convulsive activity or jerking. He was evaluated in [place] and [place] with several MRIs, and was not started on any medication. His mother recalls being told there was a "slight abnormality in the back or the brainstem."  Since then, he denied any further right-sided episodes but would have "trigger sparks" of light on the right visual field lasting up to 6 minutes, occurring once a year or so. This year he had it twice. He was sitting in the back of the car on 09/06/14 and saw the trigger sparks on his right visual field, then had tingling in his right foot that shot up the entire right side. He was unable to see anything out the right side, and tried to speak but could not verbalize. He kicked his mother's seat with his left left to get her attention, and when she looked back she heard him making growling noises with his head turned to the left side, body tensed up for 8 minutes. He had urinary incontinence. He recalls waking up in the ambulance with right-sided numbness, he felt he could not move his entire body, his speech was slurred for a while. He was brought to [facility] ER where CBC showed a WBC of 15.4, neutrophils 80%, CMP showed a creatinine of 1.29, LFTs normal, UDS positive for opiates and THC. I personally reviewed head CT without contrast which did not show any acute changes. He does admit to using THC, but denies any opiate intake. He denies any sleep deprivation or alcohol use prior to the seizure. He uses the hookah daily.   His mother brings some records from his childhood. He started having symptoms at age 22 months. His eyes are always fixed to the right, drooling, he cannot move his limbs. These episodes occur when he is about to wake  up or go to sleep. He was reported to have right hand jerking. He was evaluated at [facility] by Dr. Carolanne Grumbling, impression was recurrent partial seizures, suggestive of benign rolandic epilepsy. They moved to [place], and there were several more seizures in 2002, with note of right arm and leg weakness that lasted up to 1-1/2 hours. He was evaluated by epileptologist Dr. Inez Catalina at [facility], per mother's notes, there were no physical abnormalities on MRI but the EEG showed a slight abnormality in  his brain waves, similar to results from [place], reports unavailable for review. There were no further seizures after 01/13/2001.   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 Otherwise had a normal early development. There is no history of febrile convulsions, CNS infections such as meningitis/encephalitis, significant traumatic brain injury, neurosurgical procedures, or family history of seizures.  Diagnostic Data: I personally reviewed MRI brain with and without contrast done 09/19/14 and discussed this with the radiologist as well. There is asymmetry noted in the left caudate nucleus, the head is present however the body appears to be absent, with the tail presumably absent as well. There is a focal convex outpouching along the body of the left lateral ventricle where the body of the caudate should be. There is a 1.56mm cyst at the anterior margin of the left caudate head. No discrete heterotopia identified. The temporal horn of the left lateral ventricle is mildly enlarged compared to the right with mild rotation of the left hippocampal head and body, however no abnormal signal is identified in the hippocampus. There is a hyperintense T1 and T2 signal along the left aspect of the posterior falx at the level of the occipital lobes measuring 1-8mm in thickness without mass effect, suggestive of trace subdural hematoma. His routine EEG was abnormal with focal slowing over the left hemisphere, maximal over the left temporo-occipital region, no epileptiform discharges seen.  PAST MEDICAL HISTORY: Past Medical History:  Diagnosis Date  . Seizures (HCC)     MEDICATIONS: Current Outpatient Medications on  File Prior to Visit  Medication Sig Dispense Refill  . lamoTRIgine (LAMICTAL) 100 MG tablet Take 1 tablet (100 mg total) by mouth 2 (two) times daily. 180 tablet 2   No current facility-administered medications on file prior to visit.     ALLERGIES: No Known Allergies  FAMILY HISTORY: Family History  Problem Relation Age of Onset  . Heart disease Maternal Grandfather   . Diabetes Maternal Grandfather   . Diabetes Maternal Grandmother   . Cancer Maternal Grandmother   . Hypertension Mother   . Cancer Paternal Grandfather     SOCIAL HISTORY: Social History   Socioeconomic History  . Marital status: Single    Spouse name: Not on file  . Number of children: Not on file  . Years of education: Not on file  . Highest education level: Not on file  Occupational History  . Not on file  Social Needs  . Financial resource strain: Not on file  . Food insecurity:    Worry: Not on file    Inability: Not on file  . Transportation needs:    Medical: Not on file    Non-medical: Not on file  Tobacco Use  . Smoking status: Current Every Day Smoker    Types: E-cigarettes  . Smokeless tobacco: Never Used  Substance and Sexual Activity  . Alcohol use: No    Alcohol/week: 0.0 oz  . Drug use: Yes    Types: Marijuana  . Sexual activity: Not on file  Lifestyle  . Physical activity:    Days per week: Not on file    Minutes per session: Not on file  . Stress: Not on file  Relationships  . Social connections:    Talks on phone: Not on file    Gets together: Not on file    Attends religious service: Not on file    Active member of club or organization: Not on file    Attends meetings of clubs or organizations: Not on file    Relationship status: Not on file  . Intimate partner violence:    Fear of current or ex partner: Not on file    Emotionally abused: Not on file    Physically abused: Not on file    Forced sexual activity: Not on file  Other Topics Concern  . Not on file    Social History Narrative  . Not on file    REVIEW OF SYSTEMS: Constitutional: No fevers, chills, or sweats, no generalized fatigue, change in appetite Eyes: No visual changes, double vision, eye pain Ear, nose and throat: No hearing loss, ear pain, nasal congestion, sore throat Cardiovascular: No chest pain, palpitations Respiratory:  No shortness of breath at rest or with exertion, wheezes GastrointestinaI: No nausea, vomiting, diarrhea, abdominal pain, fecal incontinence Genitourinary:  No dysuria, urinary retention or frequency Musculoskeletal:  No neck pain, back pain Integumentary: No rash, pruritus, skin lesions Neurological: as above Psychiatric: No depression, insomnia, anxiety Endocrine: No palpitations, fatigue, diaphoresis, mood swings, change in appetite, no change in weight, increased thirst Hematologic/Lymphatic:  No anemia, purpura, petechiae. Allergic/Immunologic: no itchy/runny eyes, nasal congestion, recent allergic reactions, rashes  PHYSICAL EXAM: Vitals:   08/03/17 1505  BP: 122/68  Pulse: 65  SpO2: 99%   General: No acute distress Head:  Normocephalic/atraumatic Neck: supple, no paraspinal tenderness, full range of motion Heart:  Regular rate and rhythm Lungs:  Clear to auscultation bilaterally Back: No paraspinal tenderness Skin/Extremities: No rash, no edema Neurological Exam: alert and oriented to person, place, and time. No aphasia or dysarthria. Fund of knowledge is appropriate.  Recent and remote memory are intact. 3/3 delayed recall. Attention and concentration are normal.    Able to name objects and repeat phrases. Cranial nerves: Pupils equal, round, reactive to light.  Extraocular movements intact with no nystagmus. Visual fields full. Facial sensation intact. No facial asymmetry. Tongue, uvula, palate midline.  Motor: Bulk and tone normal, muscle strength 5/5 throughout with no pronator drift.  Sensation to light touch intact.  No extinction to  double simultaneous stimulation.  Finger to nose testing intact.  Gait narrow-based and steady, able to tandem walk adequately.  Romberg negative.  IMPRESSION: This is a 22 yo LH man with a history of childhood episodes of recurrent right-sided weakness with right gaze deviation that resolved at age 43, recurrent episodes of vision changes on the right visual field, who had the same visual changes followed by right-sided sensory symptoms, speech arrest, then a witnessed GTC last 09/06/14. 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  It is unclear how he would have a subdural, he did not hit his head in the car, I wonder about change in the occipital region with the visual symptoms he has been having for several years. He denies any further visual changes or convulsions since starting Lamictal. He continues on Lamotrigine  BID without side effects or seizures since 09/06/14. His parents expressed concern about mood/motivation, I discussed seeing a counselor, he would like to think about it. He is aware of [place] driving laws that indicate one should not drive until 6 months seizure-free. He will follow-up in 1 year and knows to call our office for any problems in the interim.  Thank you for allowing me to participate in his care.  Please do not hesitate to call for any questions or concerns.  The duration of this appointment visit was 25 minutes of face-to-face time with the patient.  Greater than 50% of this time was spent in counseling, explanation of diagnosis, planning of further management, and coordination of care.   Patrcia Dolly, M.D.   CC: Lovenia Kim, PA-C

## 2018-08-04 ENCOUNTER — Ambulatory Visit: Payer: BLUE CROSS/BLUE SHIELD | Admitting: Neurology

## 2018-09-18 ENCOUNTER — Other Ambulatory Visit: Payer: Self-pay | Admitting: Neurology

## 2018-10-12 ENCOUNTER — Other Ambulatory Visit: Payer: Self-pay | Admitting: Neurology

## 2018-11-11 ENCOUNTER — Other Ambulatory Visit: Payer: Self-pay | Admitting: Neurology

## 2020-03-20 ENCOUNTER — Emergency Department (HOSPITAL_COMMUNITY): Payer: BLUE CROSS/BLUE SHIELD

## 2020-03-20 ENCOUNTER — Emergency Department (HOSPITAL_COMMUNITY)
Admission: EM | Admit: 2020-03-20 | Discharge: 2020-03-20 | Disposition: A | Discharge status: Home / Self Care | Payer: BLUE CROSS/BLUE SHIELD | Attending: Emergency Medicine | Admitting: Emergency Medicine

## 2020-03-20 DIAGNOSIS — H539 Unspecified visual disturbance: Secondary | ICD-10-CM | POA: Diagnosis not present

## 2020-03-20 DIAGNOSIS — F17290 Nicotine dependence, other tobacco product, uncomplicated: Secondary | ICD-10-CM | POA: Diagnosis not present

## 2020-03-20 DIAGNOSIS — R569 Unspecified convulsions: Secondary | ICD-10-CM | POA: Diagnosis present

## 2020-03-20 DIAGNOSIS — R519 Headache, unspecified: Secondary | ICD-10-CM | POA: Insufficient documentation

## 2020-03-20 DIAGNOSIS — D72829 Elevated white blood cell count, unspecified: Secondary | ICD-10-CM | POA: Diagnosis not present

## 2020-03-20 LAB — BASIC METABOLIC PANEL
Anion gap: 12 (ref 5–15)
BUN: 12 mg/dL (ref 6–20)
CO2: 21 mmol/L — ABNORMAL LOW (ref 22–32)
Calcium: 9.5 mg/dL (ref 8.9–10.3)
Chloride: 105 mmol/L (ref 98–111)
Creatinine, Ser: 0.89 mg/dL (ref 0.61–1.24)
GFR, Estimated: >60 mL/min (ref >60)
Glucose, Bld: 89 mg/dL (ref 70–99)
Potassium: 4.1 mmol/L (ref 3.5–5.1)
Sodium: 138 mmol/L (ref 135–145)

## 2020-03-20 LAB — RAPID URINE DRUG SCREEN, HOSP PERFORMED
Amphetamines: NOT DETECTED
Barbiturates: NOT DETECTED
Benzodiazepines: NOT DETECTED
Cocaine: NOT DETECTED
Opiates: NOT DETECTED
Tetrahydrocannabinol: POSITIVE — AB

## 2020-03-20 LAB — CBC WITH DIFFERENTIAL/PLATELET
Abs Immature Granulocytes: 0.09 10*3/uL — ABNORMAL HIGH (ref 0.00–0.07)
Basophils Absolute: 0 10*3/uL (ref 0.0–0.1)
Basophils Relative: 0 %
Eosinophils Absolute: 0 10*3/uL (ref 0.0–0.5)
Eosinophils Relative: 0 %
HCT: 47.5 % (ref 39.0–52.0)
Hemoglobin: 16 g/dL (ref 13.0–17.0)
Immature Granulocytes: 1 %
Lymphocytes Relative: 8 %
Lymphs Abs: 1.3 10*3/uL (ref 0.7–4.0)
MCH: 30.2 pg (ref 26.0–34.0)
MCHC: 33.7 g/dL (ref 30.0–36.0)
MCV: 89.6 fL (ref 80.0–100.0)
Monocytes Absolute: 1.1 10*3/uL — ABNORMAL HIGH (ref 0.1–1.0)
Monocytes Relative: 7 %
Neutro Abs: 14.2 10*3/uL — ABNORMAL HIGH (ref 1.7–7.7)
Neutrophils Relative %: 84 %
Platelets: 223 10*3/uL (ref 150–400)
RBC: 5.3 MIL/uL (ref 4.22–5.81)
RDW: 12.1 % (ref 11.5–15.5)
WBC: 16.8 10*3/uL — ABNORMAL HIGH (ref 4.0–10.5)
nRBC: 0 % (ref 0.0–0.2)

## 2020-03-20 LAB — URINALYSIS, ROUTINE W REFLEX MICROSCOPIC
Bacteria, UA: NONE SEEN
Bilirubin Urine: NEGATIVE
Glucose, UA: NEGATIVE mg/dL
Hgb urine dipstick: NEGATIVE
Ketones, ur: NEGATIVE mg/dL
Leukocytes,Ua: NEGATIVE
Nitrite: NEGATIVE
Protein, ur: 30 mg/dL — AB
Specific Gravity, Urine: 1.016 (ref 1.005–1.030)
pH: 6 (ref 5.0–8.0)

## 2020-03-20 LAB — PHOSPHORUS: Phosphorus: 1.5 mg/dL — ABNORMAL LOW (ref 2.5–4.6)

## 2020-03-20 LAB — ETHANOL: Alcohol, Ethyl (B): <10 mg/dL (ref <10)

## 2020-03-20 LAB — CBG MONITORING, ED: Glucose-Capillary: 78 mg/dL (ref 70–99)

## 2020-03-20 LAB — MAGNESIUM: Magnesium: 2.6 mg/dL — ABNORMAL HIGH (ref 1.7–2.4)

## 2020-03-20 MED ORDER — SODIUM CHLORIDE 0.9 % IV BOLUS
1000.0000 mL | Freq: Once | INTRAVENOUS | Status: AC
  Administered 2020-03-20: 1000 mL via INTRAVENOUS

## 2020-03-20 MED ORDER — SODIUM CHLORIDE 0.9 % IV SOLN: INTRAVENOUS | Status: DC

## 2020-03-20 NOTE — ED Notes (Signed)
Seizure pads placecd on bed  for safety measure

## 2020-03-20 NOTE — ED Triage Notes (Signed)
Presents via EMS with c/o seizure activity. Upon EMS arrival pt was A/O x4. Pt does have hx of seizures and hasnt had one in about 5 years.

## 2020-03-20 NOTE — Discharge Instructions (Signed)
You have been seen and evaluated for a recent seizure episode.  Please note this is an incomplete visit as we have not obtained all of the appropriate labs yet.  Furthermore, we have not performed a CT scan of your head and your face to make sure there are no broken bones or blood in your brain.  Please call and follow-up closely with your neurologist for further managements of your seizure.  Return to the ER promptly if your symptoms worsen.

## 2020-03-20 NOTE — ED Provider Notes (Signed)
[facility]   CSN: [id number] Arrival date & time: 03/20/20  1523     History Chief Complaint  Patient presents with  . Seizures    Larry Martinez is a 24 y.o. male.  The history is provided by the patient. No language interpreter was used.     24 year old male prior history of seizures on Lamictal brought here via EMS for evaluation of seizure activity.  Patient report approximately 2 to 3 hours ago he was at a gas station pumping gas when he felt an aura which he described as some fuzziness vision changes and color changes to his right corner of his vision.  He did attempt to set down however he ended up having a seizure episodes, landed forward and struck his face against the ground.  He is currently complaining of some mild headache and facial pain and report having some body tremors earlier which has since improved.  He report his vision is back to normal, denies any fever, neck pain, cold symptoms, chest pain, trouble breathing, abdominal pain, or pain to his extremities.  He is up-to-date with tetanus.  He report having history of seizures in the past however last episode was approximately 5 to 6 years ago.  He is currently taking lamotrigine and states that he has not always been taking it regularly and may have missed a few doses several days prior.  He also admits that his sleeping habit has not been great.  Patient denies any increase in stress, does use marijuana on occasion last use was yesterday.  Denies alcohol abuse tobacco abuse.  Denies any recent sickness.  He has not been vaccinated for COVID-19.  He has been eating and drinking fine.  Past Medical History:  Diagnosis Date  . Seizures [facility])     Patient Active Problem List   Diagnosis Date Noted  . Abnormal brain MRI 10/23/2014  . Localization-related idiopathic epilepsy and epileptic syndromes with seizures of localized onset, not intractable, without status  epilepticus (HCC) 09/09/2014    No past surgical history on file.     Family History  Problem Relation Age of Onset  . Heart disease Maternal Grandfather   . Diabetes Maternal Grandfather   . Diabetes Maternal Grandmother   . Cancer Maternal Grandmother   . Hypertension Mother   . Cancer Paternal Grandfather     Social History   Tobacco Use  . Smoking status: Current Every Day Smoker    Types: E-cigarettes  . Smokeless tobacco: Never Used  Vaping Use  . Vaping Use: Every day  Substance Use Topics  . Alcohol use: No    Alcohol/week: 0.0 standard drinks  . Drug use: Yes    Types: Marijuana    Home Medications Prior to Admission medications   Medication Sig Start Date End Date Taking? Authorizing Provider  lamoTRIgine (LAMICTAL) 100 MG tablet TAKE 1 TABLET BY MOUTH TWICE A DAY 09/18/18   Van Clines, MD    Allergies    Patient has no known allergies.  Review of Systems   Review of Systems  All other systems reviewed and are negative.   Physical Exam Updated Vital Signs BP 121/70 (BP Location: Left Arm)   Pulse 92   Temp 98.5 F (36.9 C) (Oral)   Resp 20   Ht 5\' 11"  (1.803 m)   Wt 81.6 kg   SpO2 100%   BMI 25.10 kg/m   Physical Exam Vitals and nursing note reviewed.  Constitutional:  General: He is not in acute distress.    Appearance: He is well-developed and well-nourished.     Comments: Patient is alert and oriented and appears to be in no acute discomfort  HENT:     Head: Normocephalic.     Comments: No scalp tenderness however there is ecchymosis around bilateral eyebrows with abrasions.  Mild tenderness but no crepitus.  Eye movement is intact.  Bruising noted to nasal bridge however no hemotympanum, no septal hematoma, no malocclusion, no tongue injury Eyes:     Extraocular Movements: Extraocular movements intact.     Conjunctiva/sclera: Conjunctivae normal.     Pupils: Pupils are equal, round, and reactive to light.  Cardiovascular:      Rate and Rhythm: Normal rate and regular rhythm.     Pulses: Normal pulses.     Heart sounds: Normal heart sounds.  Pulmonary:     Effort: Pulmonary effort is normal.     Breath sounds: Normal breath sounds. No wheezing, rhonchi or rales.  Abdominal:     Palpations: Abdomen is soft.     Tenderness: There is no abdominal tenderness.  Musculoskeletal:        General: Normal range of motion.     Cervical back: Normal range of motion and neck supple. No rigidity or tenderness.     Comments: Small abrasions noted to the dorsum of the third and fourth digits on the right hand with normal finger range of motion and minimal tenderness to palpation  Skin:    General: Skin is warm.     Findings: No rash.  Neurological:     Mental Status: He is alert and oriented to person, place, and time.  Psychiatric:        Mood and Affect: Mood and affect and mood normal.     ED Results / Procedures / Treatments   Labs (all labs ordered are listed, but only abnormal results are displayed) Labs Reviewed  BASIC METABOLIC PANEL - Abnormal; Notable for the following components:      Result Value   CO2 21 (*)    All other components within normal limits  CBC WITH DIFFERENTIAL/PLATELET - Abnormal; Notable for the following components:   WBC 16.8 (*)    Neutro Abs 14.2 (*)    Monocytes Absolute 1.1 (*)    Abs Immature Granulocytes 0.09 (*)    All other components within normal limits  MAGNESIUM - Abnormal; Notable for the following components:   Magnesium 2.6 (*)    All other components within normal limits  PHOSPHORUS - Abnormal; Notable for the following components:   Phosphorus 1.5 (*)    All other components within normal limits  URINALYSIS, ROUTINE W REFLEX MICROSCOPIC - Abnormal; Notable for the following components:   Protein, ur 30 (*)    All other components within normal limits  ETHANOL  RAPID URINE DRUG SCREEN, HOSP PERFORMED  CBG MONITORING, ED    EKG EKG  Interpretation  Date/Time:  Thursday March 20 2020 16:55:26 EST Ventricular Rate:  94 PR Interval:    QRS Duration: 97 QT Interval:  363 QTC Calculation: 454 R Axis:   76 Text Interpretation: Sinus rhythm RSR' in V1 or V2, probably normal variant ST elev, probable normal early repol pattern No old tracing to compare Confirmed by Mancel Bale [phone]) on 03/20/2020 5:19:19 PM   Radiology No results found.  Procedures Procedures (including critical care time)  Medications Ordered in ED Medications  sodium chloride 0.9 % bolus 1,000 mL (  1,000 mLs Intravenous New Bag/Given 03/20/20 1707)    And  0.9 %  sodium chloride infusion (has no administration in time range)    ED Course  I have reviewed the triage vital signs and the nursing notes.  Pertinent labs & imaging results that were available during my care of the patient were reviewed by me and considered in my medical decision making (see chart for details).    MDM Rules/Calculators/A&P                          BP 128/72 (BP Location: Left Arm)   Pulse 89   Temp 99.1 F (37.3 C) (Oral)   Resp 17   Ht 5\' 11"  (1.803 m)   Wt 81.6 kg   SpO2 99%   BMI 25.10 kg/m   Final Clinical Impression(s) / ED Diagnoses Final diagnoses:  Seizure (HCC)    Rx / DC Orders ED Discharge Orders    None     4:38 PM Patient with history of seizures on lamotrigine who had a seizure episode preceded by an aura earlier today.  At this time he is alert and oriented and answer questions appropriately.  No obvious provocative factor identified aside from poor sleeping habit and occasional marijuana use.  He does endorse missing taking his lamotrigine on occasion but have been taking the appropriate dose for the past several days.  Work-up initiated.  5:50 PM At this time patient and father informed that they would like to refuse head and maxillofacial CT scan stating that they do not think it will provide any useful information.  I spent  time explaining the reason of the CT scan to ensure he does not have any intracranial bleed or any facial fracture.  Both patient and father acknowledged the risks of not having a CT scan such as missed injury.  There are able to make informed decision and acknowledged risk and benefit.  We will cancel CT scan per request.  Care discussed with Dr. .  7:19 PM Elevated white count of 16.8 likely stress demargination.  UA without signs of urinary tract infection, labs are reassuring.  Mild hypermagnesemia anemia with a magnesium of 2.6.  Mild hypophosphatemia with a phosphorus of 1.5.  Understand patient and father request to go home as they are back to the baseline.  Encouraged to follow-up closely with outpatient neurology for further care.   Effie Shy, PA-C 03/20/20 1920    03/22/20, MD 03/21/20 [phone]

## 2020-03-20 NOTE — ED Notes (Signed)
Called lab about urine results and she advised that they just received specimen. Time stamp collected was 1817.

## 2020-03-20 NOTE — ED Notes (Signed)
Informed by pt and pts father that they refused the CT scans. They then wanted to know what we were waiting on and after discussing with EDP, he wished to wait on labs and urine, so I explained to pt and his father that we would need urine for everything to be complete.
